# Patient Record
Sex: Female | Born: 1960 | Race: White | Hispanic: No | Marital: Married | Smoking: Former smoker
Health system: Southern US, Community
[De-identification: ages and names within clinical notes are randomized; demographics above are authoritative.]

## PROBLEM LIST (undated history)

## (undated) DIAGNOSIS — N6320 Unspecified lump in the left breast, unspecified quadrant: Secondary | ICD-10-CM

## (undated) DIAGNOSIS — E785 Hyperlipidemia, unspecified: Secondary | ICD-10-CM

## (undated) DIAGNOSIS — K219 Gastro-esophageal reflux disease without esophagitis: Secondary | ICD-10-CM

## (undated) DIAGNOSIS — K2960 Other gastritis without bleeding: Secondary | ICD-10-CM

## (undated) DIAGNOSIS — I10 Essential (primary) hypertension: Secondary | ICD-10-CM

## (undated) DIAGNOSIS — K2270 Barrett's esophagus without dysplasia: Secondary | ICD-10-CM

## (undated) DIAGNOSIS — M1990 Unspecified osteoarthritis, unspecified site: Secondary | ICD-10-CM

## (undated) DIAGNOSIS — K635 Polyp of colon: Secondary | ICD-10-CM

## (undated) DIAGNOSIS — E669 Obesity, unspecified: Secondary | ICD-10-CM

## (undated) DIAGNOSIS — E039 Hypothyroidism, unspecified: Secondary | ICD-10-CM

## (undated) HISTORY — PX: TONSILLECTOMY: SUR1361

---

## 2007-09-22 ENCOUNTER — Ambulatory Visit: Payer: Self-pay

## 2008-05-24 ENCOUNTER — Ambulatory Visit: Payer: Self-pay | Admitting: Urology

## 2009-05-16 ENCOUNTER — Ambulatory Visit: Payer: Self-pay

## 2013-02-16 ENCOUNTER — Ambulatory Visit: Payer: Self-pay | Admitting: Nurse Practitioner

## 2013-02-21 ENCOUNTER — Ambulatory Visit: Payer: Self-pay | Admitting: Nurse Practitioner

## 2013-03-31 HISTORY — PX: COLONOSCOPY: SHX174

## 2013-04-19 ENCOUNTER — Ambulatory Visit: Payer: Self-pay | Admitting: Gastroenterology

## 2013-04-21 LAB — PATHOLOGY REPORT

## 2013-09-06 ENCOUNTER — Ambulatory Visit: Payer: Self-pay | Admitting: Nurse Practitioner

## 2014-03-02 ENCOUNTER — Ambulatory Visit: Payer: Self-pay | Admitting: Nurse Practitioner

## 2014-08-31 ENCOUNTER — Other Ambulatory Visit: Payer: Self-pay | Admitting: Nurse Practitioner

## 2014-08-31 DIAGNOSIS — N6310 Unspecified lump in the right breast, unspecified quadrant: Secondary | ICD-10-CM

## 2014-09-05 ENCOUNTER — Other Ambulatory Visit: Payer: Self-pay

## 2014-09-05 ENCOUNTER — Ambulatory Visit: Payer: Self-pay

## 2014-09-12 ENCOUNTER — Ambulatory Visit
Admission: RE | Admit: 2014-09-12 | Discharge: 2014-09-12 | Disposition: A | Discharge status: Home / Self Care | Payer: 59 | Source: Ambulatory Visit | Attending: Nurse Practitioner | Admitting: Nurse Practitioner

## 2014-09-12 ENCOUNTER — Ambulatory Visit: Payer: Self-pay

## 2014-09-12 ENCOUNTER — Other Ambulatory Visit: Payer: Self-pay | Admitting: Nurse Practitioner

## 2014-09-12 DIAGNOSIS — N63 Unspecified lump in breast: Secondary | ICD-10-CM | POA: Diagnosis present

## 2014-09-12 DIAGNOSIS — N6489 Other specified disorders of breast: Secondary | ICD-10-CM | POA: Insufficient documentation

## 2014-09-12 DIAGNOSIS — N6310 Unspecified lump in the right breast, unspecified quadrant: Secondary | ICD-10-CM

## 2015-03-06 ENCOUNTER — Other Ambulatory Visit: Payer: Self-pay | Admitting: Nurse Practitioner

## 2015-03-06 DIAGNOSIS — Z1231 Encounter for screening mammogram for malignant neoplasm of breast: Secondary | ICD-10-CM

## 2015-03-22 ENCOUNTER — Ambulatory Visit
Admission: RE | Admit: 2015-03-22 | Discharge: 2015-03-22 | Disposition: A | Discharge status: Home / Self Care | Payer: 59 | Source: Ambulatory Visit | Attending: Nurse Practitioner | Admitting: Nurse Practitioner

## 2015-03-22 ENCOUNTER — Other Ambulatory Visit: Payer: Self-pay | Admitting: Nurse Practitioner

## 2015-03-22 DIAGNOSIS — Z1231 Encounter for screening mammogram for malignant neoplasm of breast: Secondary | ICD-10-CM

## 2015-03-22 DIAGNOSIS — N6489 Other specified disorders of breast: Secondary | ICD-10-CM | POA: Insufficient documentation

## 2016-06-26 ENCOUNTER — Other Ambulatory Visit: Payer: Self-pay | Admitting: Internal Medicine

## 2016-06-26 DIAGNOSIS — Z1231 Encounter for screening mammogram for malignant neoplasm of breast: Secondary | ICD-10-CM

## 2016-06-27 ENCOUNTER — Other Ambulatory Visit: Payer: Self-pay | Admitting: Nurse Practitioner

## 2016-06-27 DIAGNOSIS — Z1231 Encounter for screening mammogram for malignant neoplasm of breast: Secondary | ICD-10-CM

## 2016-08-04 ENCOUNTER — Other Ambulatory Visit: Payer: Self-pay | Admitting: Nurse Practitioner

## 2016-08-04 ENCOUNTER — Ambulatory Visit
Admission: RE | Admit: 2016-08-04 | Discharge: 2016-08-04 | Disposition: A | Discharge status: Home / Self Care | Payer: BLUE CROSS/BLUE SHIELD | Source: Ambulatory Visit | Attending: Nurse Practitioner | Admitting: Nurse Practitioner

## 2016-08-04 ENCOUNTER — Ambulatory Visit
Admission: RE | Admit: 2016-08-04 | Discharge: 2016-08-04 | Disposition: A | Discharge status: Home / Self Care | Payer: BLUE CROSS/BLUE SHIELD | Source: Ambulatory Visit

## 2016-08-04 DIAGNOSIS — N6489 Other specified disorders of breast: Secondary | ICD-10-CM | POA: Diagnosis present

## 2016-08-04 DIAGNOSIS — Z1231 Encounter for screening mammogram for malignant neoplasm of breast: Secondary | ICD-10-CM

## 2016-09-19 ENCOUNTER — Encounter: Payer: Self-pay | Admitting: *Deleted

## 2016-09-22 ENCOUNTER — Other Ambulatory Visit: Payer: Self-pay | Admitting: Gastroenterology

## 2016-09-22 ENCOUNTER — Ambulatory Visit: Payer: BLUE CROSS/BLUE SHIELD | Admitting: Certified Registered Nurse Anesthetist

## 2016-09-22 ENCOUNTER — Ambulatory Visit
Admission: RE | Admit: 2016-09-22 | Discharge: 2016-09-22 | Disposition: A | Discharge status: Home / Self Care | Payer: BLUE CROSS/BLUE SHIELD | Source: Ambulatory Visit | Attending: Gastroenterology | Admitting: Gastroenterology

## 2016-09-22 ENCOUNTER — Encounter: Admission: RE | Payer: Self-pay | Source: Ambulatory Visit

## 2016-09-22 ENCOUNTER — Ambulatory Visit
Admission: RE | Admit: 2016-09-22 | Payer: BLUE CROSS/BLUE SHIELD | Source: Ambulatory Visit | Admitting: Gastroenterology

## 2016-09-22 ENCOUNTER — Encounter
Admission: RE | Disposition: A | Discharge status: Home / Self Care | Payer: Self-pay | Source: Ambulatory Visit | Attending: Gastroenterology

## 2016-09-22 ENCOUNTER — Encounter: Payer: Self-pay | Admitting: *Deleted

## 2016-09-22 DIAGNOSIS — K449 Diaphragmatic hernia without obstruction or gangrene: Secondary | ICD-10-CM | POA: Insufficient documentation

## 2016-09-22 DIAGNOSIS — K2980 Duodenitis without bleeding: Secondary | ICD-10-CM | POA: Insufficient documentation

## 2016-09-22 DIAGNOSIS — I10 Essential (primary) hypertension: Secondary | ICD-10-CM | POA: Insufficient documentation

## 2016-09-22 DIAGNOSIS — Z882 Allergy status to sulfonamides status: Secondary | ICD-10-CM | POA: Insufficient documentation

## 2016-09-22 DIAGNOSIS — K228 Other specified diseases of esophagus: Secondary | ICD-10-CM | POA: Insufficient documentation

## 2016-09-22 DIAGNOSIS — Z79899 Other long term (current) drug therapy: Secondary | ICD-10-CM | POA: Diagnosis not present

## 2016-09-22 DIAGNOSIS — K2950 Unspecified chronic gastritis without bleeding: Secondary | ICD-10-CM | POA: Insufficient documentation

## 2016-09-22 DIAGNOSIS — Z7982 Long term (current) use of aspirin: Secondary | ICD-10-CM | POA: Insufficient documentation

## 2016-09-22 DIAGNOSIS — Z87891 Personal history of nicotine dependence: Secondary | ICD-10-CM | POA: Insufficient documentation

## 2016-09-22 DIAGNOSIS — K219 Gastro-esophageal reflux disease without esophagitis: Secondary | ICD-10-CM | POA: Insufficient documentation

## 2016-09-22 DIAGNOSIS — R1084 Generalized abdominal pain: Secondary | ICD-10-CM

## 2016-09-22 DIAGNOSIS — Z888 Allergy status to other drugs, medicaments and biological substances status: Secondary | ICD-10-CM | POA: Diagnosis not present

## 2016-09-22 DIAGNOSIS — E785 Hyperlipidemia, unspecified: Secondary | ICD-10-CM | POA: Diagnosis not present

## 2016-09-22 DIAGNOSIS — E039 Hypothyroidism, unspecified: Secondary | ICD-10-CM | POA: Diagnosis not present

## 2016-09-22 DIAGNOSIS — K224 Dyskinesia of esophagus: Secondary | ICD-10-CM | POA: Diagnosis not present

## 2016-09-22 DIAGNOSIS — Z880 Allergy status to penicillin: Secondary | ICD-10-CM | POA: Insufficient documentation

## 2016-09-22 DIAGNOSIS — E669 Obesity, unspecified: Secondary | ICD-10-CM | POA: Diagnosis not present

## 2016-09-22 DIAGNOSIS — Z6836 Body mass index (BMI) 36.0-36.9, adult: Secondary | ICD-10-CM | POA: Diagnosis not present

## 2016-09-22 HISTORY — DX: Hypothyroidism, unspecified: E03.9

## 2016-09-22 HISTORY — DX: Essential (primary) hypertension: I10

## 2016-09-22 HISTORY — DX: Unspecified osteoarthritis, unspecified site: M19.90

## 2016-09-22 HISTORY — DX: Obesity, unspecified: E66.9

## 2016-09-22 HISTORY — PX: ESOPHAGOGASTRODUODENOSCOPY (EGD) WITH PROPOFOL: SHX5813

## 2016-09-22 HISTORY — DX: Hyperlipidemia, unspecified: E78.5

## 2016-09-22 HISTORY — DX: Gastro-esophageal reflux disease without esophagitis: K21.9

## 2016-09-22 SURGERY — ESOPHAGOGASTRODUODENOSCOPY (EGD) WITH PROPOFOL
Anesthesia: General

## 2016-09-22 SURGERY — COLONOSCOPY WITH PROPOFOL
Anesthesia: General

## 2016-09-22 MED ORDER — PROPOFOL 500 MG/50ML IV EMUL
INTRAVENOUS | Status: AC
  Filled 2016-09-22: qty 50

## 2016-09-22 MED ORDER — PROPOFOL 500 MG/50ML IV EMUL
INTRAVENOUS | Status: DC | PRN
  Administered 2016-09-22: 140 ug/kg/min via INTRAVENOUS

## 2016-09-22 MED ORDER — SODIUM CHLORIDE 0.9 % IV SOLN
INTRAVENOUS | Status: DC
  Administered 2016-09-22: 10:00:00 via INTRAVENOUS

## 2016-09-22 MED ORDER — MIDAZOLAM HCL 2 MG/2ML IJ SOLN
INTRAMUSCULAR | Status: DC | PRN
  Administered 2016-09-22: 2 mg via INTRAVENOUS

## 2016-09-22 MED ORDER — LIDOCAINE HCL (CARDIAC) 20 MG/ML IV SOLN
INTRAVENOUS | Status: DC | PRN
  Administered 2016-09-22: 50 mg via INTRAVENOUS

## 2016-09-22 MED ORDER — PROPOFOL 10 MG/ML IV BOLUS
INTRAVENOUS | Status: DC | PRN
  Administered 2016-09-22: 30 mg via INTRAVENOUS
  Administered 2016-09-22 (×2): 20 mg via INTRAVENOUS

## 2016-09-22 MED ORDER — MIDAZOLAM HCL 2 MG/2ML IJ SOLN
INTRAMUSCULAR | Status: AC
  Filled 2016-09-22: qty 2

## 2016-09-22 NOTE — Anesthesia Procedure Notes (Signed)
Date/Time: 09/22/2016 11:25 AM Performed by: Johnna Acosta Pre-anesthesia Checklist: Patient identified, Emergency Drugs available, Suction available, Patient being monitored and Timeout performed Patient Re-evaluated:Patient Re-evaluated prior to inductionOxygen Delivery Method: Nasal cannula

## 2016-09-22 NOTE — Op Note (Signed)
[facility] Gastroenterology Patient Name: Erika Hopkins Procedure Date: 09/22/2016 11:20 AM MRN: [id number] Account #: [id number] Date of Birth: [birth year] Admit Type: Outpatient Age: 56 Room: [facility] ENDO ROOM 1 Gender: Female Note Status: Finalized Procedure:            Upper GI endoscopy Indications:          Gastro-esophageal reflux disease, Failure to respond to                        medical treatment Providers:            Lollie Sails, MD Referring MD:         Juluis Rainier (Referring MD) Medicines:            Monitored Anesthesia Care Complications:        No immediate complications. Procedure:            Pre-Anesthesia Assessment:                       - ASA Grade Assessment: III - A patient with severe                        systemic disease.                       After obtaining informed consent, the endoscope was                        passed under direct vision. Throughout the procedure,                        the patient's blood pressure, pulse, and oxygen                        saturations were monitored continuously. The Endoscope                        was introduced through the mouth, and advanced to the                        third part of duodenum. The upper GI endoscopy was                        accomplished without difficulty. The patient tolerated                        the procedure well. Findings:      The Z-line was variable. Biopsies were taken with a cold forceps for       histology.      Abnormal motility was noted in the lower third of the esophagus. The       cricopharyngeus was normal. There is spasticity of the esophageal body.       Tertiary peristaltic waves are noted.      A small hiatal hernia was found. The Z-line was a variable distance from       incisors; the hiatal hernia was sliding.      Patchy minimal inflammation characterized by erosions and erythema was       found in the prepyloric region of the stomach.  Biopsies were taken with       a cold  forceps for histology. Biopsies were taken with a cold forceps       for Helicobacter pylori testing.      The cardia and gastric fundus were normal on retroflexion.      Patchy minimal inflammation characterized by granularity was found in       the duodenal bulb.      The cardia and gastric fundus were normal on retroflexion otherwise. Impression:           - Z-line variable. Biopsied.                       - Abnormal esophageal motility, consistent with                        esophageal spasm.                       - Small hiatal hernia.                       - Erosive gastritis. Biopsied.                       - Duodenitis. Recommendation:       - Use Protonix (pantoprazole) 40 mg PO BID daily. Procedure Code(s):    --- Professional ---                       [phone], Esophagogastroduodenoscopy, flexible, transoral;                        with biopsy, single or multiple Diagnosis Code(s):    --- Professional ---                       K22.8, Other specified diseases of esophagus                       K22.4, Dyskinesia of esophagus                       K44.9, Diaphragmatic hernia without obstruction or                        gangrene                       K29.60, Other gastritis without bleeding                       K29.80, Duodenitis without bleeding                       K21.9, Gastro-esophageal reflux disease without                        esophagitis CPT copyright 2016 American Medical Association. All rights reserved. The codes documented in this report are preliminary and upon coder review may  be revised to meet current compliance requirements. Lollie Sails, MD 09/22/2016 11:54:07 AM This report has been signed electronically. Number of Addenda: 0 Note Initiated On: 09/22/2016 11:20 AM      [facility]

## 2016-09-22 NOTE — Transfer of Care (Signed)
Immediate Anesthesia Transfer of Care Note  Patient: [name]  Procedure(s) Performed: Procedure(s): ESOPHAGOGASTRODUODENOSCOPY (EGD) WITH PROPOFOL (N/A)  Patient Location: PACU  Anesthesia Type:General  Level of Consciousness: sedated  Airway & Oxygen Therapy: Patient Spontanous Breathing and Patient connected to nasal cannula oxygen  Post-op Assessment: Report given to RN and Post -op Vital signs reviewed and stable  Post vital signs: Reviewed and stable  Last Vitals:  Vitals:   09/22/16 1004 09/22/16 1150  BP: (!) 188/75 (P) 99/67  Pulse: 97   Resp: 20   Temp: (!) 36 C (!) 35.6 C    Last Pain:  Vitals:   09/22/16 1150  TempSrc: Tympanic         Complications: No apparent anesthesia complications

## 2016-09-22 NOTE — H&P (Signed)
Outpatient short stay form Pre-procedure 09/22/2016 11:27 AM Erika Sails MD  Primary Physician: Mercy Riding NP  Reason for visit:  EGD  History of present illness:  Patient is a 56 year old female presenting today as above. She has had gastroesophageal reflux for several years and has treated that with a PPI. That has been for the most part effective however she is having more problems with breakthrough. She has tried some Zantac in the evening she has also been of some benefit but has not completely eliminated symptoms. She does get some occasional upper epigastric discomfort that may radiate into the right and left sides. She does take 81 mg aspirin that has been held for about a week. Also she will take a NSAID perhaps once a week. She does have a family history of gallbladder disease in her mother.    Current Facility-Administered Medications:  .  0.9 %  sodium chloride infusion, , Intravenous, Continuous, Erika Sails, MD, Last Rate: 20 mL/hr at 09/22/16 1018  Prescriptions Prior to Admission  Medication Sig Dispense Refill Last Dose  . aspirin EC 81 MG tablet Take 81 mg by mouth daily.   Past Week at Unknown time  . hydrochlorothiazide (HYDRODIURIL) 25 MG tablet Take 25 mg by mouth daily.   09/22/2016 at Unknown time  . levothyroxine (SYNTHROID, LEVOTHROID) 88 MCG tablet Take 88 mcg by mouth daily before breakfast.   09/22/2016 at Unknown time  . pantoprazole (PROTONIX) 40 MG tablet Take 40 mg by mouth daily.   09/21/2016 at Unknown time  . ranitidine (ZANTAC) 150 MG tablet Take 150 mg by mouth 2 (two) times daily.   09/21/2016 at Unknown time  . simvastatin (ZOCOR) 40 MG tablet Take 40 mg by mouth daily.   09/21/2016 at Unknown time  . Cyanocobalamin (VITAMIN B-12 PO) Take by mouth.   Not Taking at Unknown time     Allergies  Allergen Reactions  . Penicillins   . Prilosec Otc [Omeprazole Magnesium]   . Sulfa Antibiotics      Past Medical History:  Diagnosis Date  .  Arthritis   . GERD (gastroesophageal reflux disease)   . Hyperlipidemia   . Hypertension   . Hypothyroidism   . Obesity     Review of systems:      Physical Exam    Heart and lungs: Regular rate and rhythm without rub or gallop, lungs are bilaterally clear.    HEENT: Norm cephalic atraumatic eyes are anicteric    Other:     Pertinant exam for procedure: Soft nontender nondistended bowel sounds positive normoactive.    Procedure: EGD and indicated procedures. I have discussed the risks benefits and complications of procedures to include not limited to bleeding, infection, perforation and the risk of sedation and the patient wishes to proceed. Erika Sails, MD Gastroenterology 09/22/2016  11:27 AM

## 2016-09-22 NOTE — Anesthesia Preprocedure Evaluation (Addendum)
Anesthesia Evaluation  Patient identified by MRN, date of birth, ID band Patient awake    Reviewed: Allergy & Precautions, H&P , NPO status , Patient's Chart, lab work & pertinent test results, reviewed documented beta blocker date and time   Airway Mallampati: IV   Neck ROM: full    Dental  (+) Poor Dentition, Teeth Intact   Pulmonary neg pulmonary ROS, former smoker,    Pulmonary exam normal        Cardiovascular hypertension, negative cardio ROS Normal cardiovascular exam Rhythm:regular Rate:Normal     Neuro/Psych negative neurological ROS  negative psych ROS   GI/Hepatic negative GI ROS, Neg liver ROS, GERD  Medicated,  Endo/Other  negative endocrine ROSHypothyroidism   Renal/GU negative Renal ROS  negative genitourinary   Musculoskeletal   Abdominal   Peds  Hematology negative hematology ROS (+)   Anesthesia Other Findings Past Medical History: No date: Arthritis No date: GERD (gastroesophageal reflux disease) No date: Hyperlipidemia No date: Hypertension No date: Hypothyroidism No date: Obesity Past Surgical History: No date: COLONOSCOPY No date: TONSILLECTOMY BMI    Body Mass Index:  35.94 kg/m     Reproductive/Obstetrics negative OB ROS                            Anesthesia Physical Anesthesia Plan  ASA: III  Anesthesia Plan: General   Post-op Pain Management:    Induction:   PONV Risk Score and Plan: 3 and Ondansetron, Dexamethasone, Propofol and Midazolam  Airway Management Planned:   Additional Equipment:   Intra-op Plan:   Post-operative Plan:   Informed Consent: I have reviewed the patients History and Physical, chart, labs and discussed the procedure including the risks, benefits and alternatives for the proposed anesthesia with the patient or authorized representative who has indicated his/her understanding and acceptance.   Dental Advisory  Given  Plan Discussed with: CRNA  Anesthesia Plan Comments:         Anesthesia Quick Evaluation

## 2016-09-22 NOTE — Anesthesia Post-op Follow-up Note (Cosign Needed)
Anesthesia QCDR form completed.        

## 2016-09-23 LAB — SURGICAL PATHOLOGY

## 2016-09-25 NOTE — Anesthesia Postprocedure Evaluation (Signed)
Anesthesia Post Note  Patient: Erika Hopkins  Procedure(s) Performed: Procedure(s) (LRB): ESOPHAGOGASTRODUODENOSCOPY (EGD) WITH PROPOFOL (N/A)  Patient location during evaluation: PACU Anesthesia Type: General Level of consciousness: awake and alert Pain management: pain level controlled Vital Signs Assessment: post-procedure vital signs reviewed and stable Respiratory status: spontaneous breathing, nonlabored ventilation, respiratory function stable and patient connected to nasal cannula oxygen Cardiovascular status: blood pressure returned to baseline and stable Postop Assessment: no signs of nausea or vomiting Anesthetic complications: no     Last Vitals:  Vitals:   09/22/16 1210 09/22/16 1220  BP: 131/75 132/72  Pulse: 96 83  Resp: 17 14  Temp:      Last Pain:  Vitals:   09/23/16 0748  TempSrc:   PainSc: 0-No pain                 Molli Barrows

## 2016-10-07 ENCOUNTER — Ambulatory Visit
Admission: RE | Admit: 2016-10-07 | Discharge: 2016-10-07 | Disposition: A | Discharge status: Home / Self Care | Payer: BLUE CROSS/BLUE SHIELD | Source: Ambulatory Visit | Attending: Gastroenterology | Admitting: Gastroenterology

## 2016-10-07 ENCOUNTER — Encounter
Admission: RE | Admit: 2016-10-07 | Discharge: 2016-10-07 | Disposition: A | Discharge status: Home / Self Care | Payer: BLUE CROSS/BLUE SHIELD | Source: Ambulatory Visit | Attending: Gastroenterology | Admitting: Gastroenterology

## 2016-10-07 DIAGNOSIS — R1084 Generalized abdominal pain: Secondary | ICD-10-CM | POA: Insufficient documentation

## 2016-10-07 DIAGNOSIS — K760 Fatty (change of) liver, not elsewhere classified: Secondary | ICD-10-CM | POA: Insufficient documentation

## 2016-10-07 MED ORDER — TECHNETIUM TC 99M MEBROFENIN IV KIT
5.0000 | PACK | Freq: Once | INTRAVENOUS | Status: AC | PRN
  Administered 2016-10-07: 5.36 via INTRAVENOUS

## 2016-10-08 ENCOUNTER — Encounter: Payer: Self-pay | Admitting: *Deleted

## 2016-10-27 ENCOUNTER — Ambulatory Visit (INDEPENDENT_AMBULATORY_CARE_PROVIDER_SITE_OTHER): Payer: BLUE CROSS/BLUE SHIELD | Admitting: General Surgery

## 2016-10-27 ENCOUNTER — Encounter: Payer: Self-pay | Admitting: General Surgery

## 2016-10-27 VITALS — BP 150/72 | HR 108 | Resp 14 | Ht 65.5 in | Wt 215.0 lb

## 2016-10-27 DIAGNOSIS — R1013 Epigastric pain: Secondary | ICD-10-CM | POA: Diagnosis not present

## 2016-10-27 DIAGNOSIS — R1011 Right upper quadrant pain: Secondary | ICD-10-CM | POA: Insufficient documentation

## 2016-10-27 NOTE — Patient Instructions (Addendum)

## 2016-10-27 NOTE — Progress Notes (Signed)
Patient ID: Erika Hopkins, female   DOB: [birth year], 58 y.o.   MRN: [id number]  Chief Complaint  Patient presents with  . Abdominal Pain    HPI Erika Hopkins is a 56 y.o. female for evaluation of her gallbladder. She started having pain in her right side off and on for the past 4-6 months along with some pain in the sternal area on occasion. She states the pain does not move around. She has had problems with reflux for the past several years. She states the when she eats she has to have a bowel movement right afterwards. She has a bowel movement up to 5-6 times a day. She is unable to lay on her right side for any length of time due to the pain. She states that fried foods, dairy, and sweets bother her reflux. She was OK with soy milk. She had a HIDA scan done on 10/07/16. She had severe nausea after this test and some pain but not bad. She is here today with her husband Erika Hopkins. She also reports that she has had pain in the right lower abdomen ever since she was young. This area has been bothering her more often recently.      HPI  Past Medical History:  Diagnosis Date  . Arthritis   . GERD (gastroesophageal reflux disease)   . Hyperlipidemia   . Hypertension   . Hypothyroidism   . Obesity     Past Surgical History:  Procedure Laterality Date  . COLONOSCOPY  2015  . ESOPHAGOGASTRODUODENOSCOPY (EGD) WITH PROPOFOL N/A 09/22/2016   Procedure: ESOPHAGOGASTRODUODENOSCOPY (EGD) WITH PROPOFOL;  Surgeon: Lollie Sails, MD;  Location[facility] ENDOSCOPY;  Service: Endoscopy;  Laterality: N/A;  . TONSILLECTOMY      Family History  Problem Relation Age of Onset  . Breast cancer Paternal Grandmother        <50  . Breast cancer Cousin        paternal. <50  . Breast cancer Other   . Diabetes Mother     Social History Social History  Substance Use Topics  . Smoking status: Former Smoker    Packs/day: 0.50    Years: 10.00    Quit date: 03/31/1996  . Smokeless tobacco: Never Used  . Alcohol  use No    Allergies  Allergen Reactions  . Penicillins   . Prilosec Otc [Omeprazole Magnesium]   . Sulfa Antibiotics     Current Outpatient Prescriptions  Medication Sig Dispense Refill  . hydrochlorothiazide (HYDRODIURIL) 25 MG tablet Take 25 mg by mouth daily.    Marland Kitchen levothyroxine (SYNTHROID, LEVOTHROID) 88 MCG tablet Take 88 mcg by mouth daily before breakfast.    . pantoprazole (PROTONIX) 40 MG tablet Take 40 mg by mouth daily.    . simvastatin (ZOCOR) 40 MG tablet Take 40 mg by mouth daily.    Marland Kitchen aspirin EC 81 MG tablet Take 81 mg by mouth daily.    . Cyanocobalamin (VITAMIN B-12 PO) Take by mouth.    . ranitidine (ZANTAC) 150 MG tablet Take 150 mg by mouth 2 (two) times daily.     No current facility-administered medications for this visit.     Review of Systems Review of Systems  Constitutional: Negative.   Respiratory: Negative.   Cardiovascular: Negative.   Gastrointestinal: Positive for abdominal distention, abdominal pain, constipation and diarrhea. Negative for anal bleeding, blood in stool, nausea, rectal pain and vomiting.    Blood pressure (!) 150/72, pulse (!) 108, resp. rate 14,  height 5' 5.5" (1.664 m), weight 215 lb (97.5 kg).  Physical Exam Physical Exam  Constitutional: She is oriented to person, place, and time. She appears well-developed and well-nourished.  Eyes: Conjunctivae are normal. No scleral icterus.  Neck: Neck supple.  Cardiovascular: Normal rate, regular rhythm, normal heart sounds and intact distal pulses.   Pulmonary/Chest: Effort normal and breath sounds normal.  Abdominal: Soft. Normal appearance.  Lymphadenopathy:    She has no cervical adenopathy.  Neurological: She is alert and oriented to person, place, and time.  Skin: Skin is warm and dry.  Psychiatric: She has a normal mood and affect.    Data Reviewed 10/07/2016 abdominal ultrasound and HIDA scan with stimulation images and report reviewed. Ejection fraction 9%.  09/22/2016  upper endoscopy report reviewed.  Biopsies:DIAGNOSIS:  A. STOMACH, ANTRUM AND BODY; COLD BIOPSY:  - ANTRAL MUCOSA WITH MILD CHRONIC GASTRITIS.  - OXYNTIC MUCOSA WITH MINIMAL CHRONIC GASTRITIS.  - NEGATIVE FOR H. PYLORI, DYSPLASIA, AND MALIGNANCY.   B. GE JUNCTION; COLD BIOPSY:  - REFLUX GASTROESOPHAGITIS AND INTESTINAL METAPLASIA, SEE NOTE.  - NEGATIVE FOR DYSPLASIA, AND MALIGNANCY.   Note: Regarding the GE junction biopsy, if a characteristic lesion was  identified endoscopically within the GEJ on endoscopy, the histologic  findings would fulfill criteria for Barrett's esophagus.   Assessment    Atypical right upper quadrant pain, gastroesophageal reflux.    Plan       The patient presented with 2 main symptom areas: 1) right upper quadrant discomfort on an intermittent basis and 2) gastroesophageal reflux. The latter has improved with institution of twice a day PPI. Upper endoscopy showed biopsies consistent with reflux.  Biliary imaging showed no significant gallbladder distention or stones. But no real clinical correlation in terms of reproducibility of the patient's right upper quadrant discomfort. Some mild symptoms that seemed more closely associated with reflux having taken the ensure in the supine position rather than biliary pain.  Options for management were reviewed: 1) proceed to elective cholecystectomy with the idea there might be a 50-60% chance of resolution of her symptoms versus 2) observation.   The fact that both her parents had biliary tract disease suggest she may be more prone to a biliary episode in the future, but there certainly nothing with her history or imaging studies to say that she is at high-risk for an acute episode of cholecystitis or gallbladder rupture.  She was encouraged to call if her symptoms change, as at this time she is considering observation rather than surgical intervention.      HPI, Physical Exam, Assessment and Plan have been  scribed under the direction and in the presence of Robert Bellow, MD  Concepcion Living, LPN  I have completed the exam and reviewed the above documentation for accuracy and completeness.  I agree with the above.  Haematologist has been used and any errors in dictation or transcription are unintentional.  Hervey Ard, M.D., F.A.C.S.  Robert Bellow 10/27/2016, 7:59 PM

## 2016-10-28 NOTE — Progress Notes (Signed)
Thank you :)

## 2016-11-11 NOTE — Progress Notes (Signed)
Thank you :)

## 2017-02-26 ENCOUNTER — Encounter: Payer: Self-pay | Admitting: *Deleted

## 2017-02-27 ENCOUNTER — Encounter: Payer: Self-pay | Admitting: *Deleted

## 2017-02-27 ENCOUNTER — Ambulatory Visit: Payer: BLUE CROSS/BLUE SHIELD | Admitting: Anesthesiology

## 2017-02-27 ENCOUNTER — Ambulatory Visit
Admission: RE | Admit: 2017-02-27 | Discharge: 2017-02-27 | Disposition: A | Discharge status: Home / Self Care | Payer: BLUE CROSS/BLUE SHIELD | Source: Ambulatory Visit | Attending: Gastroenterology | Admitting: Gastroenterology

## 2017-02-27 ENCOUNTER — Encounter
Admission: RE | Disposition: A | Discharge status: Home / Self Care | Payer: Self-pay | Source: Ambulatory Visit | Attending: Gastroenterology

## 2017-02-27 DIAGNOSIS — K5730 Diverticulosis of large intestine without perforation or abscess without bleeding: Secondary | ICD-10-CM | POA: Insufficient documentation

## 2017-02-27 DIAGNOSIS — N6320 Unspecified lump in the left breast, unspecified quadrant: Secondary | ICD-10-CM | POA: Diagnosis not present

## 2017-02-27 DIAGNOSIS — K6389 Other specified diseases of intestine: Secondary | ICD-10-CM | POA: Diagnosis not present

## 2017-02-27 DIAGNOSIS — Z882 Allergy status to sulfonamides status: Secondary | ICD-10-CM | POA: Diagnosis not present

## 2017-02-27 DIAGNOSIS — R194 Change in bowel habit: Secondary | ICD-10-CM | POA: Diagnosis present

## 2017-02-27 DIAGNOSIS — K529 Noninfective gastroenteritis and colitis, unspecified: Secondary | ICD-10-CM | POA: Diagnosis not present

## 2017-02-27 DIAGNOSIS — R1031 Right lower quadrant pain: Secondary | ICD-10-CM | POA: Diagnosis present

## 2017-02-27 DIAGNOSIS — Z6832 Body mass index (BMI) 32.0-32.9, adult: Secondary | ICD-10-CM | POA: Diagnosis not present

## 2017-02-27 DIAGNOSIS — Z888 Allergy status to other drugs, medicaments and biological substances status: Secondary | ICD-10-CM | POA: Insufficient documentation

## 2017-02-27 DIAGNOSIS — Z880 Allergy status to penicillin: Secondary | ICD-10-CM | POA: Diagnosis not present

## 2017-02-27 DIAGNOSIS — K635 Polyp of colon: Secondary | ICD-10-CM | POA: Diagnosis not present

## 2017-02-27 DIAGNOSIS — E039 Hypothyroidism, unspecified: Secondary | ICD-10-CM | POA: Diagnosis not present

## 2017-02-27 DIAGNOSIS — Z79899 Other long term (current) drug therapy: Secondary | ICD-10-CM | POA: Insufficient documentation

## 2017-02-27 DIAGNOSIS — I10 Essential (primary) hypertension: Secondary | ICD-10-CM | POA: Insufficient documentation

## 2017-02-27 DIAGNOSIS — Z7982 Long term (current) use of aspirin: Secondary | ICD-10-CM | POA: Diagnosis not present

## 2017-02-27 DIAGNOSIS — Z86010 Personal history of colonic polyps: Secondary | ICD-10-CM | POA: Insufficient documentation

## 2017-02-27 DIAGNOSIS — E785 Hyperlipidemia, unspecified: Secondary | ICD-10-CM | POA: Diagnosis not present

## 2017-02-27 DIAGNOSIS — K828 Other specified diseases of gallbladder: Secondary | ICD-10-CM | POA: Insufficient documentation

## 2017-02-27 DIAGNOSIS — K219 Gastro-esophageal reflux disease without esophagitis: Secondary | ICD-10-CM | POA: Diagnosis not present

## 2017-02-27 DIAGNOSIS — Z8371 Family history of colonic polyps: Secondary | ICD-10-CM | POA: Diagnosis present

## 2017-02-27 DIAGNOSIS — M1990 Unspecified osteoarthritis, unspecified site: Secondary | ICD-10-CM | POA: Insufficient documentation

## 2017-02-27 DIAGNOSIS — E669 Obesity, unspecified: Secondary | ICD-10-CM | POA: Diagnosis not present

## 2017-02-27 DIAGNOSIS — D120 Benign neoplasm of cecum: Secondary | ICD-10-CM | POA: Insufficient documentation

## 2017-02-27 DIAGNOSIS — K2270 Barrett's esophagus without dysplasia: Secondary | ICD-10-CM | POA: Diagnosis not present

## 2017-02-27 HISTORY — DX: Polyp of colon: K63.5

## 2017-02-27 HISTORY — DX: Unspecified lump in the left breast, unspecified quadrant: N63.20

## 2017-02-27 HISTORY — DX: Other gastritis without bleeding: K29.60

## 2017-02-27 HISTORY — PX: COLONOSCOPY WITH PROPOFOL: SHX5780

## 2017-02-27 HISTORY — DX: Barrett's esophagus without dysplasia: K22.70

## 2017-02-27 SURGERY — COLONOSCOPY WITH PROPOFOL
Anesthesia: General

## 2017-02-27 MED ORDER — PHENYLEPHRINE HCL 10 MG/ML IJ SOLN
INTRAMUSCULAR | Status: DC | PRN
  Administered 2017-02-27 (×2): 200 ug via INTRAVENOUS

## 2017-02-27 MED ORDER — PROPOFOL 500 MG/50ML IV EMUL
INTRAVENOUS | Status: DC | PRN
Start: 2017-02-27 — End: 2017-02-27
  Administered 2017-02-27: 140 ug/kg/min via INTRAVENOUS

## 2017-02-27 MED ORDER — PROPOFOL 10 MG/ML IV BOLUS
INTRAVENOUS | Status: AC
  Filled 2017-02-27: qty 20

## 2017-02-27 MED ORDER — LIDOCAINE HCL (CARDIAC) 20 MG/ML IV SOLN
INTRAVENOUS | Status: DC | PRN
  Administered 2017-02-27: 80 mg via INTRAVENOUS

## 2017-02-27 MED ORDER — SODIUM CHLORIDE 0.9 % IV SOLN: INTRAVENOUS | Status: DC

## 2017-02-27 MED ORDER — SODIUM CHLORIDE 0.9 % IV SOLN
INTRAVENOUS | Status: DC
  Administered 2017-02-27: 14:00:00 via INTRAVENOUS

## 2017-02-27 MED ORDER — MIDAZOLAM HCL 2 MG/2ML IJ SOLN
INTRAMUSCULAR | Status: AC
  Filled 2017-02-27: qty 2

## 2017-02-27 MED ORDER — MIDAZOLAM HCL 2 MG/2ML IJ SOLN
INTRAMUSCULAR | Status: DC | PRN
  Administered 2017-02-27: 2 mg via INTRAVENOUS

## 2017-02-27 MED ORDER — PROPOFOL 10 MG/ML IV BOLUS
INTRAVENOUS | Status: DC | PRN
  Administered 2017-02-27: 60 mg via INTRAVENOUS

## 2017-02-27 MED ORDER — LIDOCAINE HCL (PF) 2 % IJ SOLN
INTRAMUSCULAR | Status: AC
  Filled 2017-02-27: qty 10

## 2017-02-27 NOTE — Op Note (Signed)
[facility] Gastroenterology Patient Name: Erika Hopkins Procedure Date: 02/27/2017 3:10 PM MRN: [id number] Account #: [id number] Date of Birth: 12/13/1960 Admit Type: Outpatient Age: 56 Room: [facility] ENDO ROOM 1 Gender: Female Note Status: Finalized Procedure:            Colonoscopy Indications:          Lower abdominal pain, Change in bowel habits Providers:            Lollie Sails, MD Referring MD:         Juluis Rainier (Referring MD) Medicines:            Monitored Anesthesia Care Complications:        No immediate complications. Procedure:            Pre-Anesthesia Assessment:                       - ASA Grade Assessment: II - A patient with mild                        systemic disease.                       After obtaining informed consent, the colonoscope was                        passed under direct vision. Throughout the procedure,                        the patient's blood pressure, pulse, and oxygen                        saturations were monitored continuously. The Olympus                        PCF-H180AL colonoscope ( S#: [id number] ) was introduced                        through the anus and advanced to the the cecum,                        identified by appendiceal orifice and ileocecal valve.                        The colonoscopy was performed without difficulty. The                        patient tolerated the procedure well. The quality of                        the bowel preparation was good. Findings:      A few small-mouthed diverticula were found in the sigmoid colon and       descending colon.      A 2 mm polyp was found adjacent to the appendiceal orifice. The polyp       was sessile. The polyp was removed with a cold biopsy forceps. Resection       and retrieval were complete.      Three sessile polyps were found in the sigmoid colon. The polyps were 1       to 3 mm in size. These polyps were removed  with a cold biopsy forceps.   Resection and retrieval were complete.      Biopsies for histology were taken with a cold forceps from the right       colon and left colon for evaluation of microscopic colitis.      The area of the previous polypectomy in the proximal transverse colon       was carefully evaluated, the tatoo difficult to find, but found to be       normal.      The exam was otherwise without abnormality.      The retroflexed view of the distal rectum and anal verge was normal and       showed no anal or rectal abnormalities. Impression:           - Diverticulosis in the sigmoid colon and in the                        descending colon.                       - One 2 mm polyp at the appendiceal orifice, removed                        with a cold biopsy forceps. Resected and retrieved.                       - Three 1 to 3 mm polyps in the sigmoid colon, removed                        with a cold biopsy forceps. Resected and retrieved.                       - The examination was otherwise normal.                       - The distal rectum and anal verge are normal on                        retroflexion view.                       - Biopsies were taken with a cold forceps from the                        right colon and left colon for evaluation of                        microscopic colitis. Recommendation:       - Discharge patient to home.                       - Advance diet as tolerated.                       - Continue present medications.                       - Use Citrucel one tablespoon PO daily daily. Procedure Code(s):    --- Professional ---                       [phone], Colonoscopy, flexible;  with biopsy, single or                        multiple Diagnosis Code(s):    --- Professional ---                       D12.1, Benign neoplasm of appendix                       D12.5, Benign neoplasm of sigmoid colon                       R10.30, Lower abdominal pain, unspecified                        R19.4, Change in bowel habit                       K57.30, Diverticulosis of large intestine without                        perforation or abscess without bleeding CPT copyright 2016 American Medical Association. All rights reserved. The codes documented in this report are preliminary and upon coder review may  be revised to meet current compliance requirements. Lollie Sails, MD 02/27/2017 3:56:43 PM This report has been signed electronically. Number of Addenda: 0 Note Initiated On: 02/27/2017 3:10 PM Scope Withdrawal Time: 0 hours 20 minutes 5 seconds  Total Procedure Duration: 0 hours 29 minutes 20 seconds       [facility]

## 2017-02-27 NOTE — Anesthesia Postprocedure Evaluation (Signed)
Anesthesia Post Note  Patient: Erika Hopkins  Procedure(s) Performed: COLONOSCOPY WITH PROPOFOL (N/A )  Patient location during evaluation: Endoscopy Anesthesia Type: General Level of consciousness: awake and alert Pain management: pain level controlled Vital Signs Assessment: post-procedure vital signs reviewed and stable Respiratory status: spontaneous breathing, nonlabored ventilation, respiratory function stable and patient connected to nasal cannula oxygen Cardiovascular status: blood pressure returned to baseline and stable Postop Assessment: no apparent nausea or vomiting Anesthetic complications: no     Last Vitals:  Vitals:   02/27/17 1554 02/27/17 1555  BP:  (!) 96/54  Pulse:    Resp:    Temp: (!) 35.9 C   SpO2:      Last Pain:  Vitals:   02/27/17 1554  TempSrc: Tympanic                 Precious Haws Anthonyjames Bargar

## 2017-02-27 NOTE — Anesthesia Preprocedure Evaluation (Signed)
Anesthesia Evaluation  Patient identified by MRN, date of birth, ID band Patient awake    Reviewed: Allergy & Precautions, NPO status , Patient's Chart, lab work & pertinent test results  History of Anesthesia Complications Negative for: history of anesthetic complications  Airway Mallampati: II  TM Distance: <3 FB Neck ROM: Full    Dental no notable dental hx.    Pulmonary neg sleep apnea, neg COPD, former smoker,    breath sounds clear to auscultation- rhonchi (-) wheezing      Cardiovascular hypertension, Pt. on medications (-) CAD, (-) Past MI and (-) Cardiac Stents  Rhythm:Regular Rate:Normal - Systolic murmurs and - Diastolic murmurs    Neuro/Psych negative neurological ROS  negative psych ROS   GI/Hepatic Neg liver ROS, PUD, GERD  ,  Endo/Other  neg diabetesHypothyroidism   Renal/GU negative Renal ROS     Musculoskeletal  (+) Arthritis ,   Abdominal (+) + obese,   Peds  Hematology negative hematology ROS (+)   Anesthesia Other Findings Past Medical History: No date: Arthritis No date: Barrett's esophagus No date: Colon polyp No date: Erosive gastritis No date: GERD (gastroesophageal reflux disease) No date: Hyperlipidemia No date: Hypertension No date: Hypothyroidism No date: Left breast mass No date: Obesity   Reproductive/Obstetrics                             Anesthesia Physical Anesthesia Plan  ASA: II  Anesthesia Plan: General   Post-op Pain Management:    Induction: Intravenous  PONV Risk Score and Plan: 2 and Propofol infusion  Airway Management Planned: Natural Airway  Additional Equipment:   Intra-op Plan:   Post-operative Plan:   Informed Consent: I have reviewed the patients History and Physical, chart, labs and discussed the procedure including the risks, benefits and alternatives for the proposed anesthesia with the patient or authorized  representative who has indicated his/her understanding and acceptance.   Dental advisory given  Plan Discussed with: CRNA and Anesthesiologist  Anesthesia Plan Comments:         Anesthesia Quick Evaluation

## 2017-02-27 NOTE — H&P (Signed)
Outpatient short stay form Pre-procedure 02/27/2017 3:11 PM Erika Sails MD  Primary Physician: Gaetano Net NP  Reason for visit:  Colonoscopy  History of present illness:  Patient is a 56 year old female presenting today as above. She has a family history of colon polyps her primary relative. She has a history of probable irritable bowel syndrome but has noted some change of bowel habits recently with some right lower quadrant abdominal discomfort. There's been no blood in stool. Patient does take 81 mg aspirin daily. She takes no other aspirin products or blood thinning agents. She tolerated her prep well.  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 She has consulted surgery with this and has elected to hold off on it for a while.    Current Facility-Administered Medications:  .  0.9 %  sodium chloride infusion, , Intravenous, Continuous, Erika Sails, MD, Last Rate: 20 mL/hr at 02/27/17 1413 .  0.9 %  sodium chloride infusion, , Intravenous, Continuous, Erika Sails, MD  Medications Prior to Admission  Medication Sig Dispense Refill Last Dose  . aspirin EC 81 MG tablet Take 81 mg by mouth daily.   Past Week at Unknown time  . Cyanocobalamin (VITAMIN B-12 PO) Take by mouth.   Past Week at Unknown time  . hydrochlorothiazide (HYDRODIURIL) 25 MG tablet Take 25 mg by mouth daily.   02/27/2017 at Unknown time  . levothyroxine (SYNTHROID, LEVOTHROID) 88 MCG tablet Take 88 mcg by mouth daily before breakfast.   02/26/2017 at Unknown time  . pantoprazole (PROTONIX) 40 MG tablet Take 40 mg by mouth daily.   02/26/2017 at Unknown time  . ranitidine (ZANTAC) 150 MG tablet Take 150 mg by mouth 2 (two) times daily.   Past Week at Unknown time  . simvastatin (ZOCOR) 40 MG tablet Take 40 mg by mouth daily.   02/26/2017 at Unknown time     Allergies   Allergen Reactions  . Penicillins   . Prilosec Otc [Omeprazole Magnesium]   . Sulfa Antibiotics      Past Medical History:  Diagnosis Date  . Arthritis   . Barrett's esophagus   . Colon polyp   . Erosive gastritis   . GERD (gastroesophageal reflux disease)   . Hyperlipidemia   . Hypertension   . Hypothyroidism   . Left breast mass   . Obesity     Review of systems:      Physical Exam    Heart and lungs: Regular rate and rhythm without rub or gallop, lungs are bilaterally clear.    HEENT: Normocephalic atraumatic eyes are anicteric    Other:     Pertinant exam for procedure: Soft nontender nondistended bowel sounds positive normoactive    Planned proceedures: Colonoscopy and indicated procedures. I have discussed the risks benefits and complications of procedures to include not limited to bleeding, infection, perforation and the risk of sedation and the patient wishes to proceed.    Erika Sails, MD Gastroenterology 02/27/2017  3:11 PM

## 2017-02-27 NOTE — Transfer of Care (Signed)
Immediate Anesthesia Transfer of Care Note  Patient: Erika Hopkins  Procedure(s) Performed: COLONOSCOPY WITH PROPOFOL (N/A )  Patient Location: Endoscopy Unit  Anesthesia Type:General  Level of Consciousness: drowsy and patient cooperative  Airway & Oxygen Therapy: Patient Spontanous Breathing and Patient connected to nasal cannula oxygen  Post-op Assessment: Report given to RN and Post -op Vital signs reviewed and stable  Post vital signs: Reviewed and stable  Last Vitals:  Vitals:   02/27/17 1359 02/27/17 1554  BP: (!) 159/87   Pulse: (!) 127   Resp: 20   Temp: 36.4 C (!) 35.9 C  SpO2: 98%     Last Pain:  Vitals:   02/27/17 1554  TempSrc: Tympanic      Patients Stated Pain Goal: 0 (87/19/59 [id number])  Complications: No apparent anesthesia complications

## 2017-02-27 NOTE — Anesthesia Post-op Follow-up Note (Signed)
Anesthesia QCDR form completed.        

## 2017-03-02 ENCOUNTER — Encounter: Payer: Self-pay | Admitting: Gastroenterology

## 2017-03-04 LAB — SURGICAL PATHOLOGY

## 2017-04-21 ENCOUNTER — Ambulatory Visit: Payer: BLUE CROSS/BLUE SHIELD | Admitting: General Surgery

## 2017-04-21 ENCOUNTER — Encounter: Payer: Self-pay | Admitting: General Surgery

## 2017-04-21 VITALS — BP 144/72 | HR 104 | Resp 12 | Ht 65.5 in | Wt 198.0 lb

## 2017-04-21 DIAGNOSIS — R1011 Right upper quadrant pain: Secondary | ICD-10-CM | POA: Diagnosis not present

## 2017-04-21 DIAGNOSIS — R1031 Right lower quadrant pain: Secondary | ICD-10-CM | POA: Diagnosis not present

## 2017-04-21 NOTE — Patient Instructions (Addendum)
CT abdomen and pelvis  The patient is aware to call back for any questions or concerns.  The patient is scheduled for a CT Abdomen and pelvis with oral and IV contrast at [place] on 04/30/17 at 11:00 am. She will arrive by 10:45 am and have nothing to eat or drink for 4 hours prior. She will also need to pick up a prep kit. The patient is aware of date, time, and instructions.

## 2017-04-21 NOTE — Progress Notes (Signed)
Patient ID: Erika Hopkins, female   DOB: 03-20-1961, 57 y.o.   MRN: [id number]  Chief Complaint  Patient presents with  . Abdominal Pain    HPI Erika Hopkins is a 57 y.o. female.  Here for re evaluation of right abdominal pain. She feel like the area swells and pushes her ribs. She states it is persistent upper right abdomen and occasional lower right abdomen.Twisting activity like vacuuming may make it worse. She is left handed. Once in a while she will hurt upper left abdomen which has been occurring for 8 years. Not related to particular foods. Mild nausea but no vomiting.  She is taking hyoscyamine when she has diarrhea for her IBS, and her stools fluctuate between diarrhea and constipation. Colonoscopy was completed by Dr Donnella Sham November.  She was seen in July for an evaluation with ultrasound and HIDA scan.  HPI  Past Medical History:  Diagnosis Date  . Arthritis   . Barrett's esophagus   . Colon polyp   . Erosive gastritis   . GERD (gastroesophageal reflux disease)   . Hyperlipidemia   . Hypertension   . Hypothyroidism   . Left breast mass   . Obesity     Past Surgical History:  Procedure Laterality Date  . COLONOSCOPY  2015  . COLONOSCOPY WITH PROPOFOL N/A 02/27/2017   Procedure: COLONOSCOPY WITH PROPOFOL;  Surgeon: Lollie Sails, MD;  Location: [facility] ENDOSCOPY;  Service: Endoscopy;  Laterality: N/A;  . ESOPHAGOGASTRODUODENOSCOPY (EGD) WITH PROPOFOL N/A 09/22/2016   Procedure: ESOPHAGOGASTRODUODENOSCOPY (EGD) WITH PROPOFOL;  Surgeon: Lollie Sails, MD;  Location: [facility] ENDOSCOPY;  Service: Endoscopy;  Laterality: N/A;  . TONSILLECTOMY      Family History  Problem Relation Age of Onset  . Breast cancer Paternal Grandmother        <50  . Breast cancer Cousin        paternal. <50  . Breast cancer Other   . Diabetes Mother     Social History Social History   Tobacco Use  . Smoking status: Former Smoker    Packs/day: 0.50    Years: 10.00    Pack years:  5.00    Last attempt to quit: 03/31/1996    Years since quitting: 21.0  . Smokeless tobacco: Never Used  Substance Use Topics  . Alcohol use: No  . Drug use: No    Allergies  Allergen Reactions  . Penicillins   . Prilosec Otc [Omeprazole Magnesium]   . Sulfa Antibiotics     Current Outpatient Medications  Medication Sig Dispense Refill  . hydrochlorothiazide (HYDRODIURIL) 25 MG tablet Take 25 mg by mouth daily.    . hyoscyamine (LEVSIN, ANASPAZ) 0.125 MG tablet Take by mouth.    . levothyroxine (SYNTHROID, LEVOTHROID) 88 MCG tablet Take 88 mcg by mouth daily before breakfast.    . pantoprazole (PROTONIX) 40 MG tablet Take 40 mg by mouth daily.    . PSYLLIUM PO Take by mouth.    . ranitidine (ZANTAC) 150 MG tablet Take 150 mg by mouth as needed.     . simvastatin (ZOCOR) 40 MG tablet Take 40 mg by mouth daily.     No current facility-administered medications for this visit.     Review of Systems Review of Systems  Constitutional: Negative.   Respiratory: Negative.   Cardiovascular: Negative.   Gastrointestinal: Positive for abdominal pain, constipation, diarrhea and nausea. Negative for vomiting.    Blood pressure (!) 144/72, pulse (!) 104, resp. rate 12, height  5' 5.5" (1.664 m), weight 198 lb (89.8 kg).  Physical Exam Physical Exam  Constitutional: She is oriented to person, place, and time. She appears well-developed and well-nourished.  HENT:  Mouth/Throat: Oropharynx is clear and moist.  Eyes: Conjunctivae are normal. No scleral icterus.  Neck: Neck supple.  Cardiovascular: Normal rate, regular rhythm and normal heart sounds.  Pulmonary/Chest: Effort normal and breath sounds normal.  Abdominal: Soft. Normal appearance. There is tenderness (mid clavicular line) in the left upper quadrant.    Musculoskeletal:  Tenderness right paraspinal at T8-9  Neurological: She is alert and oriented to person, place, and time.  Skin: Skin is warm and dry.  Psychiatric: Her  behavior is normal.    Data Reviewed Abdominal ultrasound of October 07, 2016 suggested fatty liver.  HIDA scan with ejection fraction dated October 07, 2016 showed a decreased ejection fraction of 9%.  No reproduction or exacerbation of abdominal symptoms  Colonoscopy of February 27, 2017 showed a tubular adenoma of the cecum.  Random biopsies were negative for colitis.  Hyperplastic polyp in the sigmoid colon.  Assessment    Progressive abdominal pain, especially in the right upper quadrant.    Plan    The lack of reproduction of her symptoms with stimulation of the gallbladder during her July HIDA scan and absence of stones on ultrasound would suggest is unlikely the cholecystectomy will relieve her symptoms.  With her progressive abdominal discomfort, a CT scan may help elucidate a source for her pain.      HPI, Physical Exam, Assessment and Plan have been scribed under the direction and in the presence of Robert Bellow, MD. Karie Fetch, RN  The patient is scheduled for a CT Abdomen and pelvis with oral and IV contrast at [place] on 04/30/17 at 11:00 am. She will arrive by 10:45 am and have nothing to eat or drink for 4 hours prior. She will also need to pick up a prep kit. The patient is aware of date, time, and instructions.  Documented by Caryl-Lyn Otis Brace LPN  Forest Gleason Ad Guttman 04/23/2017, 6:55 AM

## 2017-04-23 NOTE — Progress Notes (Signed)
Thank you :)

## 2017-04-30 ENCOUNTER — Ambulatory Visit
Admission: RE | Admit: 2017-04-30 | Discharge: 2017-04-30 | Disposition: A | Discharge status: Home / Self Care | Payer: BLUE CROSS/BLUE SHIELD | Source: Ambulatory Visit | Attending: General Surgery | Admitting: General Surgery

## 2017-04-30 DIAGNOSIS — R1011 Right upper quadrant pain: Secondary | ICD-10-CM | POA: Diagnosis not present

## 2017-04-30 DIAGNOSIS — I700 Atherosclerosis of aorta: Secondary | ICD-10-CM | POA: Diagnosis not present

## 2017-04-30 DIAGNOSIS — R1031 Right lower quadrant pain: Secondary | ICD-10-CM | POA: Insufficient documentation

## 2017-04-30 DIAGNOSIS — K449 Diaphragmatic hernia without obstruction or gangrene: Secondary | ICD-10-CM | POA: Diagnosis not present

## 2017-04-30 MED ORDER — IOPAMIDOL (ISOVUE-300) INJECTION 61%
100.0000 mL | Freq: Once | INTRAVENOUS | Status: AC | PRN
  Administered 2017-04-30: 100 mL via INTRAVENOUS

## 2017-06-29 ENCOUNTER — Other Ambulatory Visit: Payer: Self-pay | Admitting: Nurse Practitioner

## 2017-06-29 DIAGNOSIS — Z1231 Encounter for screening mammogram for malignant neoplasm of breast: Secondary | ICD-10-CM

## 2017-08-06 ENCOUNTER — Ambulatory Visit
Admission: RE | Admit: 2017-08-06 | Discharge: 2017-08-06 | Disposition: A | Discharge status: Home / Self Care | Payer: BLUE CROSS/BLUE SHIELD | Source: Ambulatory Visit | Attending: Nurse Practitioner | Admitting: Nurse Practitioner

## 2017-08-06 DIAGNOSIS — Z1231 Encounter for screening mammogram for malignant neoplasm of breast: Secondary | ICD-10-CM | POA: Diagnosis not present

## 2019-04-04 IMAGING — CT CT ABD-PELV W/ CM
2 of 5 series · 17 of 46 positions shown, 19 images · IV contrast (iopamidol)
Comparison: None.

CLINICAL DATA: Generalized abdominal pain for 1 year.

EXAM:
CT ABDOMEN AND PELVIS WITH CONTRAST
TECHNIQUE: Multidetector CT imaging of the abdomen and pelvis was performed
using the standard protocol following bolus administration of
intravenous contrast.
CONTRAST:  100mL [id number] IOPAMIDOL ([id number]) INJECTION 61%

[Series 2: abd pelvis 5.00 br36 s3 · axial · 0.73mm/px · z∈[-1697,-1292]mm · 14 of 91 slices shown, 16 images]
[im 5/91  soft-tissue]
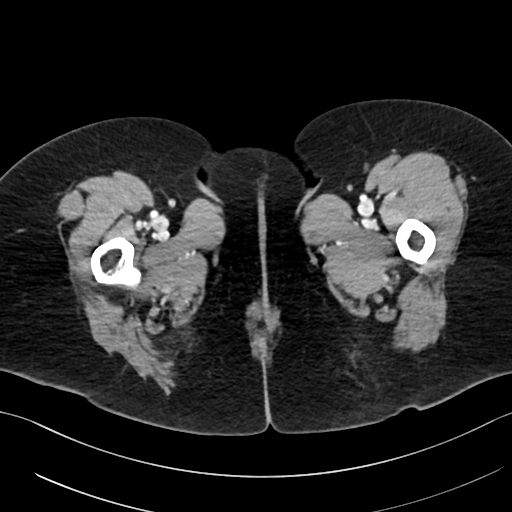
[im 5/91  bone]
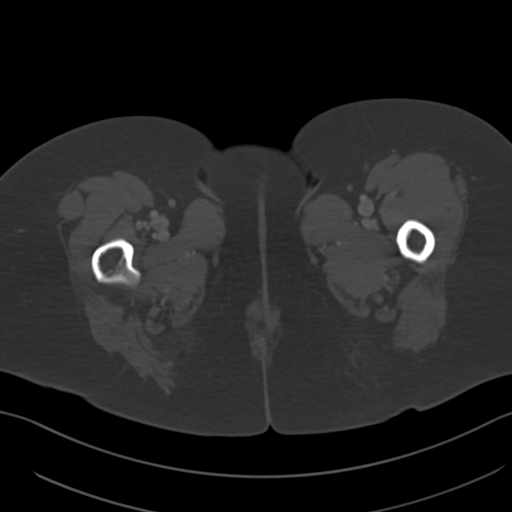
[im 10/91  soft-tissue]
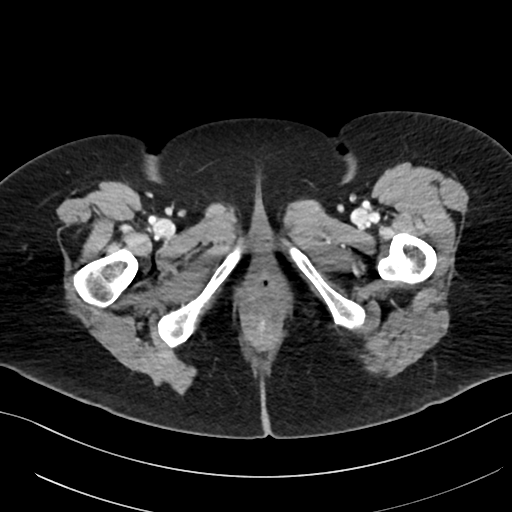
[im 19/91  soft-tissue]
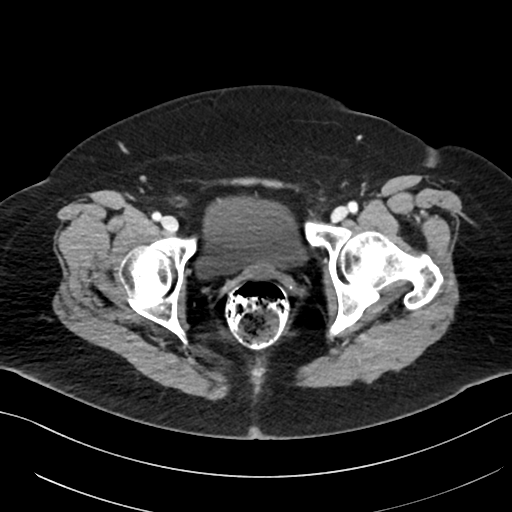
[im 24/91  soft-tissue]
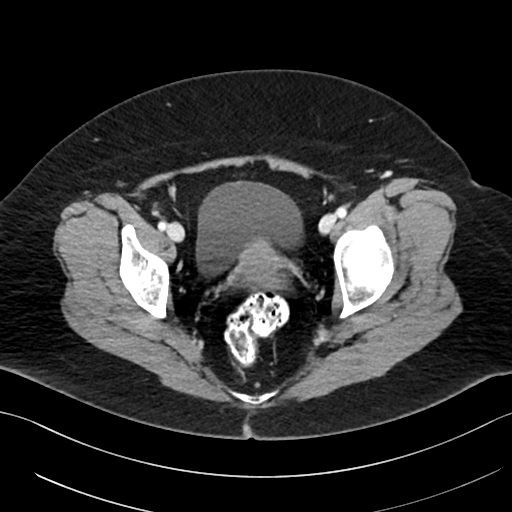
[im 29/91  soft-tissue]
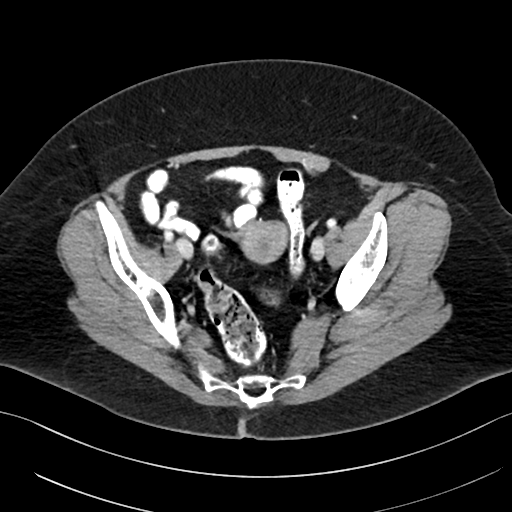
[im 38/91  soft-tissue]
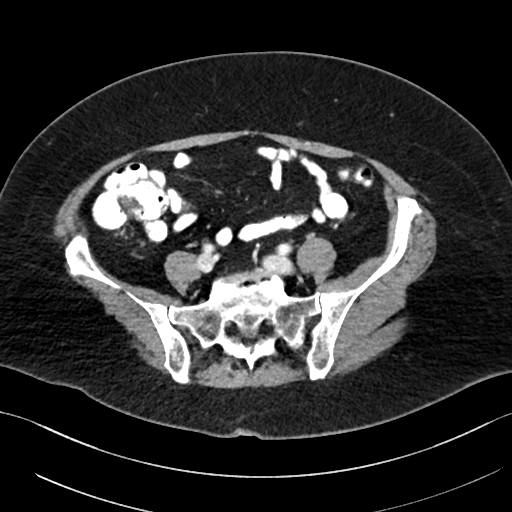
[im 43/91  soft-tissue]
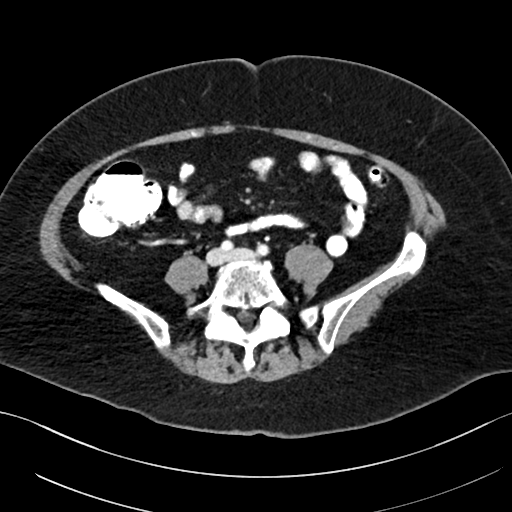
[im 48/91  soft-tissue]
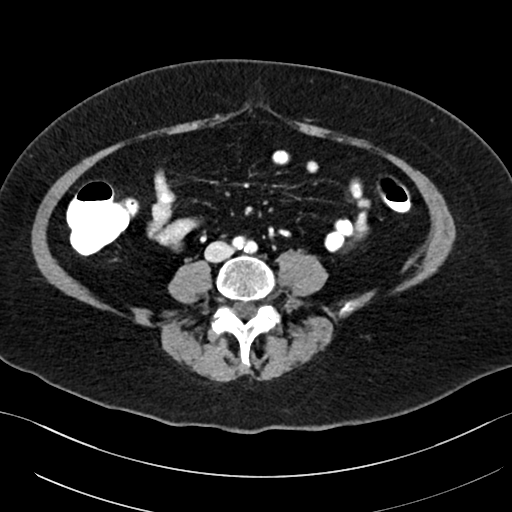
[im 53/91  soft-tissue]
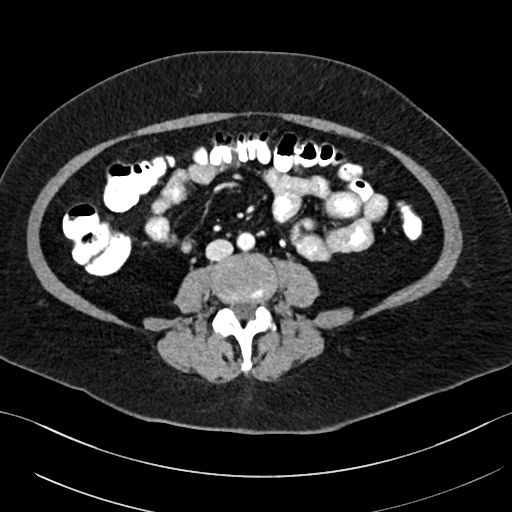
[im 53/91  bone]
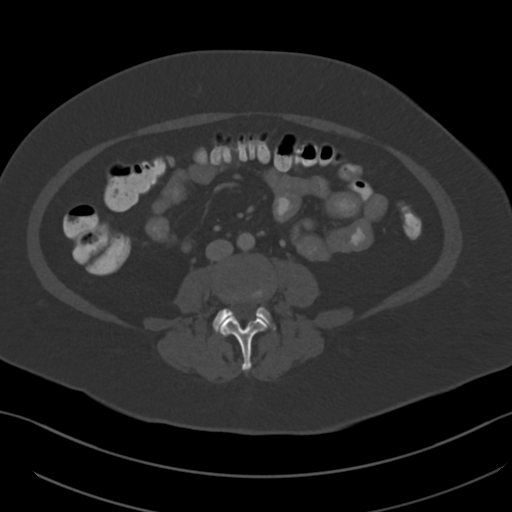
[im 62/91  soft-tissue]
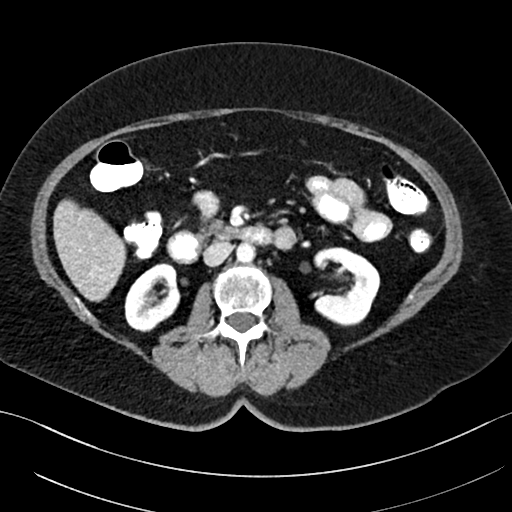
[im 67/91  soft-tissue]
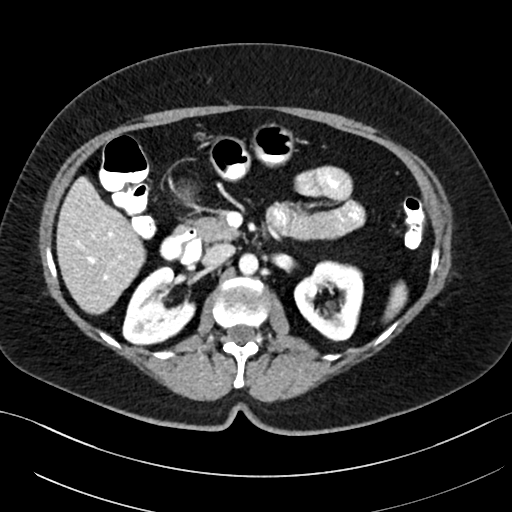
[im 72/91  soft-tissue]
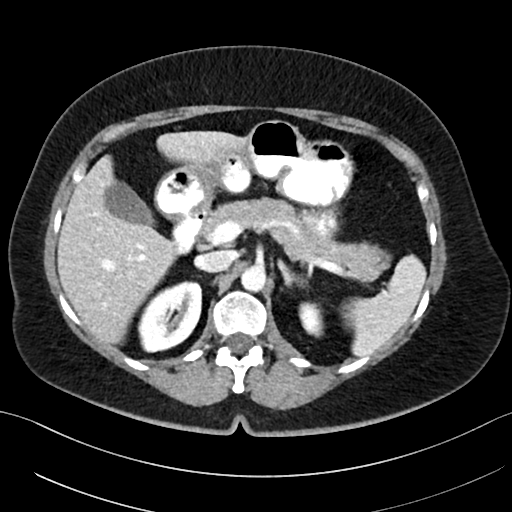
[im 81/91  soft-tissue]
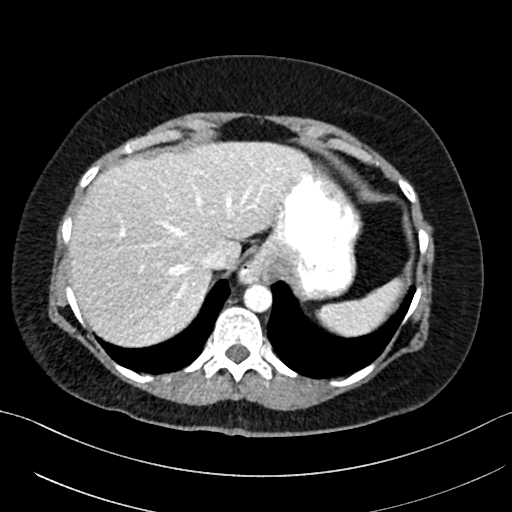
[im 86/91  soft-tissue]
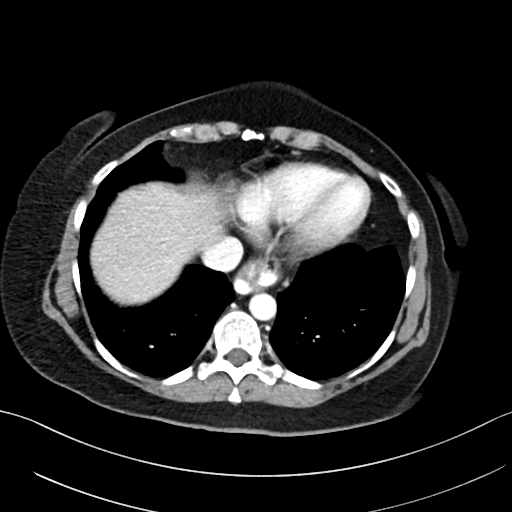

[Series 4: abd pelvis 2.00 br40 s3 cor · coronal · 0.83mm/px · 3 of 146 slices shown]
[im 49/146  soft-tissue]
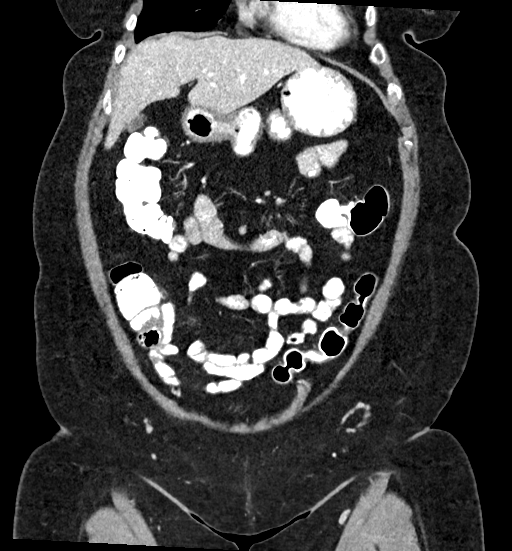
[im 65/146  soft-tissue]
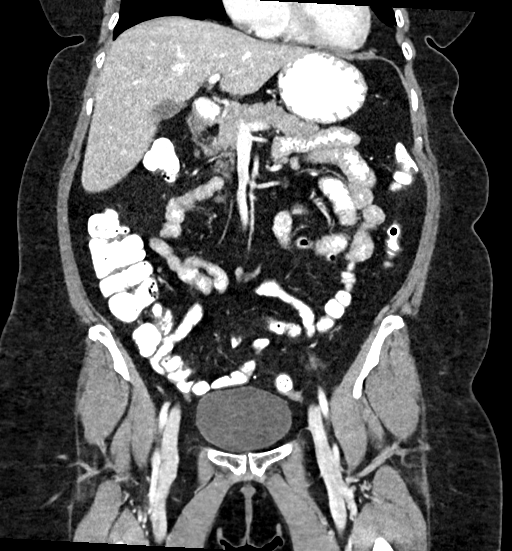
[im 81/146  soft-tissue]
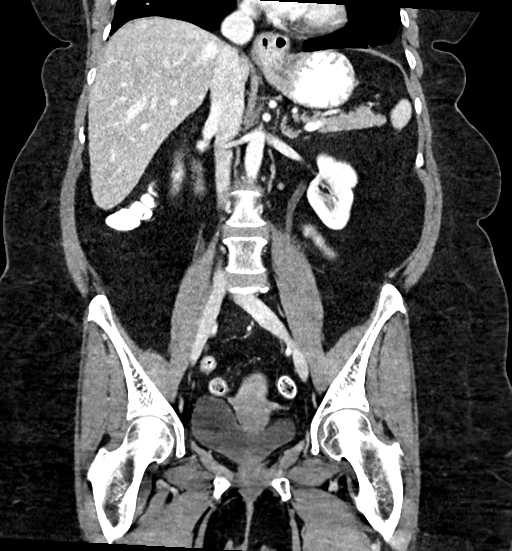

[17 of 46 positions shown; findings below may reference images not displayed]

FINDINGS: Lower Chest: No acute findings.

Hepatobiliary: No hepatic masses identified. Gallbladder is
unremarkable. No evidence of biliary ductal dilatation.

Pancreas:  No mass or inflammatory changes.

Spleen: Within normal limits in size and appearance.

Adrenals/Urinary Tract: No masses identified. Small cyst noted in
lower pole of left kidney. No evidence of hydronephrosis.
Unremarkable unopacified urinary bladder.

Stomach/Bowel: Small hiatal hernia. No evidence of obstruction,
inflammatory process or abnormal fluid collections.

Vascular/Lymphatic: No pathologically enlarged lymph nodes. No
abdominal aortic aneurysm. Aortic atherosclerosis.

Reproductive:  No mass or other significant abnormality.

Other:  None.

Musculoskeletal:  No suspicious bone lesions identified.
IMPRESSION: No acute findings.

Small hiatal hernia.

Aortic atherosclerosis.
